# Patient Record
Sex: Male | Born: 1985 | Race: White | Hispanic: No | Marital: Single | Smoking: Current every day smoker
Health system: Southern US, Community
[De-identification: ages and names within clinical notes are randomized; demographics above are authoritative.]

## PROBLEM LIST (undated history)

## (undated) HISTORY — PX: KNEE SURGERY: SHX244

---

## 2019-11-16 ENCOUNTER — Other Ambulatory Visit: Payer: Self-pay

## 2019-11-16 ENCOUNTER — Encounter (HOSPITAL_BASED_OUTPATIENT_CLINIC_OR_DEPARTMENT_OTHER): Payer: Self-pay | Admitting: *Deleted

## 2019-11-16 DIAGNOSIS — M5137 Other intervertebral disc degeneration, lumbosacral region: Secondary | ICD-10-CM | POA: Insufficient documentation

## 2019-11-16 DIAGNOSIS — F17210 Nicotine dependence, cigarettes, uncomplicated: Secondary | ICD-10-CM | POA: Insufficient documentation

## 2019-11-16 LAB — URINALYSIS, ROUTINE W REFLEX MICROSCOPIC
Bilirubin Urine: NEGATIVE
Glucose, UA: NEGATIVE mg/dL
Hgb urine dipstick: NEGATIVE
Ketones, ur: NEGATIVE mg/dL
Leukocytes,Ua: NEGATIVE
Nitrite: NEGATIVE
Protein, ur: NEGATIVE mg/dL
Specific Gravity, Urine: >1.03 — ABNORMAL HIGH (ref 1.005–1.030)
pH: 5.5 (ref 5.0–8.0)

## 2019-11-16 NOTE — ED Triage Notes (Addendum)
Lower back pain. States he has had the pain while in prison for 6 months. He got out of prison 2 days ago and came  here for xrays.

## 2019-11-17 ENCOUNTER — Emergency Department (HOSPITAL_BASED_OUTPATIENT_CLINIC_OR_DEPARTMENT_OTHER): Payer: Self-pay

## 2019-11-17 ENCOUNTER — Emergency Department (HOSPITAL_BASED_OUTPATIENT_CLINIC_OR_DEPARTMENT_OTHER)
Admission: EM | Admit: 2019-11-17 | Discharge: 2019-11-17 | Disposition: A | Discharge status: Home / Self Care | Payer: Self-pay | Attending: Emergency Medicine | Admitting: Emergency Medicine

## 2019-11-17 DIAGNOSIS — M5137 Other intervertebral disc degeneration, lumbosacral region: Secondary | ICD-10-CM

## 2019-11-17 DIAGNOSIS — M545 Low back pain, unspecified: Secondary | ICD-10-CM

## 2019-11-17 MED ORDER — NAPROXEN 250 MG PO TABS
500.0000 mg | ORAL_TABLET | Freq: Once | ORAL | Status: AC
  Administered 2019-11-17: 500 mg via ORAL
  Filled 2019-11-17: qty 2

## 2019-11-17 MED ORDER — OXYCODONE-ACETAMINOPHEN 5-325 MG PO TABS: 1.0000 | ORAL_TABLET | Freq: Four times a day (QID) | ORAL | 0 refills | Status: AC | PRN

## 2019-11-17 MED ORDER — NAPROXEN 375 MG PO TABS: ORAL_TABLET | ORAL | 0 refills | Status: AC

## 2019-11-17 NOTE — ED Notes (Signed)
Pt states being on waiting list in jail for back xrays 'for months' was dc'd this AM & states [facility] directed him to ER to be evaluated. Pain across entire lower back and does not radiate.

## 2019-11-17 NOTE — ED Provider Notes (Signed)
[facility] Provider Note: Lowella Dell, MD, FACEP  CSN: [id number] MRN: [id number] ARRIVAL: 11/16/19 at 2138 ROOM: [facility]04/[facility]04   CHIEF COMPLAINT  Back Pain   HISTORY OF PRESENT ILLNESS  11/17/19 3:58 AM David Moran is a 34 y.o. male who has had low back pain for 6 months while incarcerated.  He got out of prison 2 days ago and is here requesting x-rays.  It is bilateral but worse on the left.  He rates his pain as a 7 out of 10, worse with palpation or movement of the left lower back.  He was previously on oxycodone for chronic knee pain but has not had any medications for the past 6 months due to incarceration.  He denies any radiation of the pain to his buttock or leg.  He is having no numbness or weakness with it.   History reviewed. No pertinent past medical history.  Past Surgical History:  Procedure Laterality Date  . KNEE SURGERY      No family history on file.  Social History   Tobacco Use  . Smoking status: Current Every Day Smoker  . Smokeless tobacco: Never Used  Substance Use Topics  . Alcohol use: Not Currently  . Drug use: Not Currently    Prior to Admission medications   Medication Sig Start Date End Date Taking? Authorizing Provider  naproxen (NAPROSYN) 375 MG tablet Take 1 tablet twice daily as needed for back pain. 11/17/19   Ronna Herskowitz, MD  oxyCODONE-acetaminophen (PERCOCET) 5-325 MG tablet Take 1 tablet by mouth every 6 (six) hours as needed for severe pain. 11/17/19   Lorea Kupfer, Jonny Ruiz, MD    Allergies Patient has no known allergies.   REVIEW OF SYSTEMS  Negative except as noted here or in the History of Present Illness.   PHYSICAL EXAMINATION  Initial Vital Signs Blood pressure 121/83, pulse 90, temperature 98.7 F (37.1 C), temperature source Oral, resp. rate 16, height 6' (1.829 m), weight 85.7 kg, SpO2 100 %.  Examination General: Well-developed, well-nourished male in no acute distress; appearance consistent with age of  record HENT: normocephalic; atraumatic Eyes: Normal appearance Neck: supple Heart: regular rate and rhythm Lungs: clear to auscultation bilaterally Abdomen: soft; nondistended; nontender; bowel sounds present Back: Left paralumbar tenderness; positive straight leg raise on the left at 45 degrees Extremities: No deformity; full range of motion; pulses normal Neurologic: Awake, alert and oriented; motor function intact in all extremities and symmetric; no facial droop Skin: Warm and dry Psychiatric: Normal mood and affect   RESULTS  Summary of this visit's results, reviewed and interpreted by myself:   EKG Interpretation  Date/Time:    Ventricular Rate:    PR Interval:    QRS Duration:   QT Interval:    QTC Calculation:   R Axis:     Text Interpretation:        Laboratory Studies: Results for orders placed or performed during the hospital encounter of 11/17/19 (from the past 24 hour(s))  Urinalysis, Routine w reflex microscopic     Status: Abnormal   Collection Time: 11/16/19 10:00 PM  Result Value Ref Range   Color, Urine YELLOW YELLOW   APPearance CLEAR CLEAR   Specific Gravity, Urine >1.030 (H) 1.005 - 1.030   pH 5.5 5.0 - 8.0   Glucose, UA NEGATIVE NEGATIVE mg/dL   Hgb urine dipstick NEGATIVE NEGATIVE   Bilirubin Urine NEGATIVE NEGATIVE   Ketones, ur NEGATIVE NEGATIVE mg/dL   Protein, ur NEGATIVE NEGATIVE mg/dL  Nitrite NEGATIVE NEGATIVE   Leukocytes,Ua NEGATIVE NEGATIVE   Imaging Studies: DG Lumbar Spine 2-3 Views  Result Date: 11/17/2019 CLINICAL DATA:  Low back pain for 6 months. EXAM: LUMBAR SPINE - 2-3 VIEW COMPARISON:  None. FINDINGS: There are 5 non rib-bearing lumbar type vertebrae. There is mild lumbar dextroscoliosis, and there is slight retrolisthesis of L5 on S1. No fracture is identified. Disc space narrowing is mild at L4-5 and moderate at L5-S1. IMPRESSION: Lumbar dextroscoliosis and lower lumbar disc degeneration, greatest at L5-S1. Electronically  Signed   By: Sebastian Ache M.D.   On: 11/17/2019 04:23    ED COURSE and MDM  Nursing notes, initial and subsequent vitals signs, including pulse oximetry, reviewed and interpreted by myself.  Vitals:   11/16/19 2154 11/16/19 2156 11/16/19 2157 11/17/19 0240  BP: 119/76   121/83  Pulse: 94   90  Resp: 19   16  Temp:   98.4 F (36.9 C) 98.7 F (37.1 C)  TempSrc:   Oral Oral  SpO2: 100%   100%  Weight:  85.7 kg    Height:  6' (1.829 m)     Medications  naproxen (NAPROSYN) tablet 500 mg (has no administration in time range)    We will refer patient to sports medicine for further evaluation.  PROCEDURES  Procedures   ED DIAGNOSES     ICD-10-CM   1. Degeneration of lumbar or lumbosacral intervertebral disc  M51.37   2. Acute left-sided low back pain without sciatica  M54.5        Keianna Signer, Jonny Ruiz, MD 11/17/19 [phone]

## 2019-11-29 ENCOUNTER — Telehealth: Payer: Self-pay | Admitting: Family Medicine

## 2019-11-29 NOTE — Telephone Encounter (Signed)
Dr. Read Drivers referred pt to Dr. Jordan Likes for trmt --Unsuccessful attempt to contact pt (llist ph# to a [facility] agency no answer nor ability to lv msg)  --glh

## 2021-08-04 IMAGING — DX DG LUMBAR SPINE 2-3V
3 series · 3 of 3 positions shown · non-contrast
Comparison: None.

CLINICAL DATA: Low back pain for 6 months.

EXAM:
LUMBAR SPINE - 2-3 VIEW

[l-spine ap (1 of 2)]
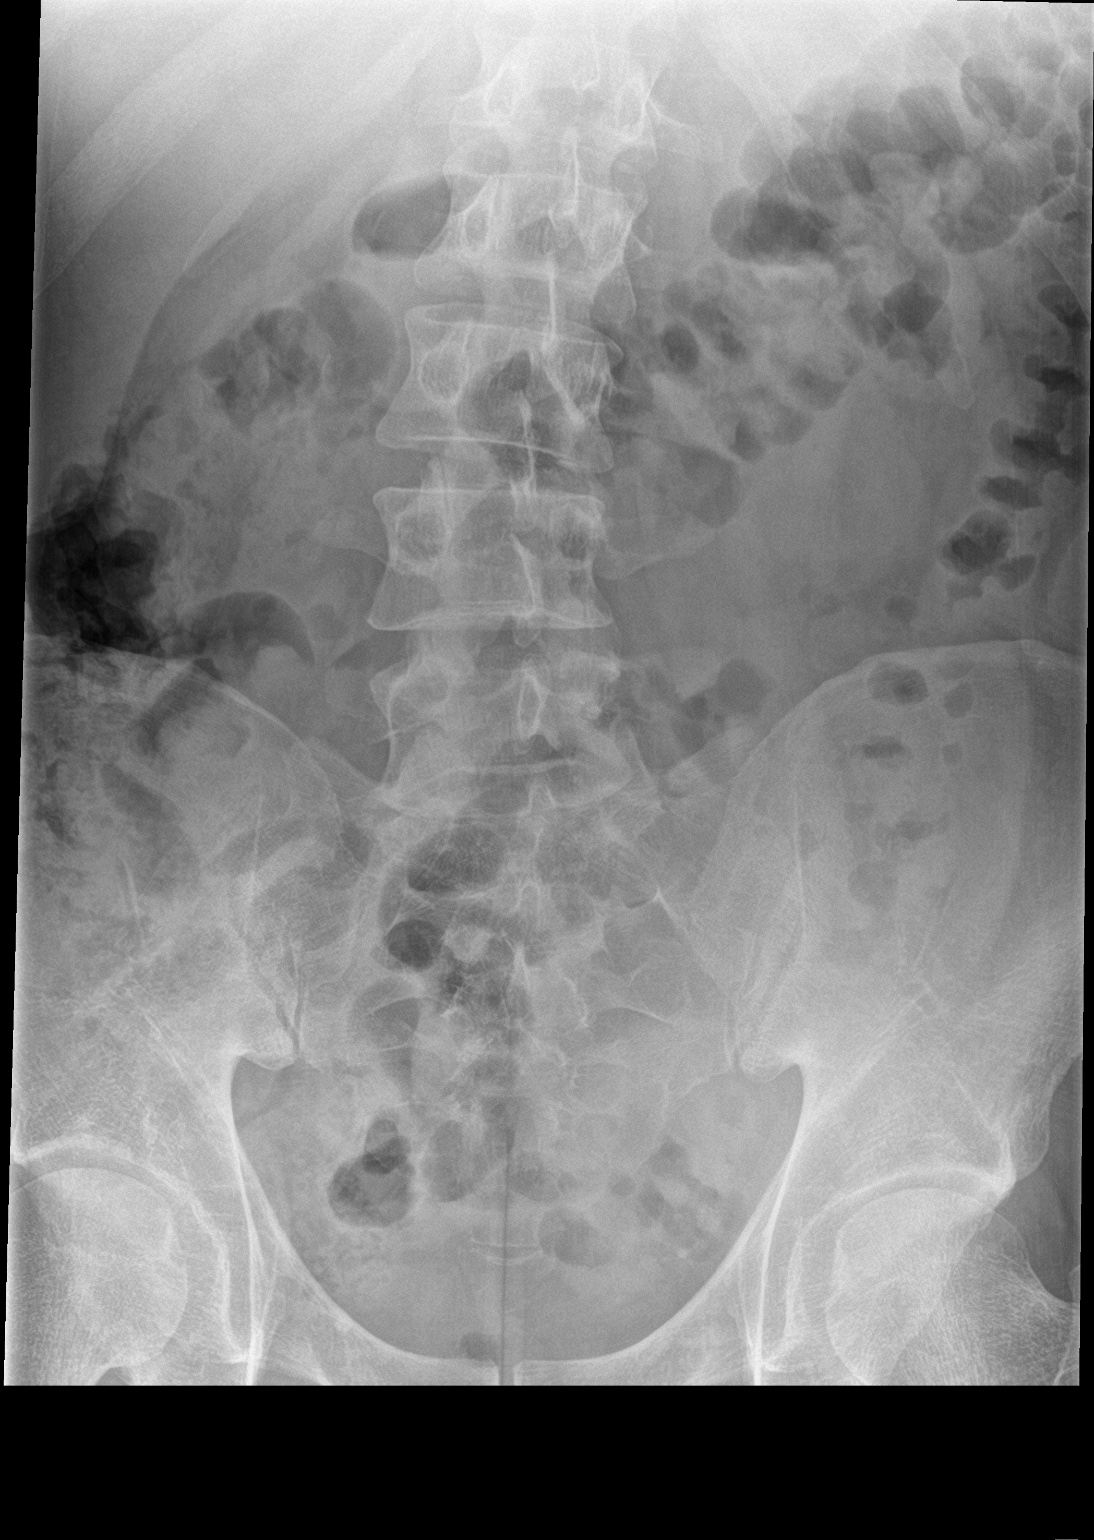

[l-spine lat]
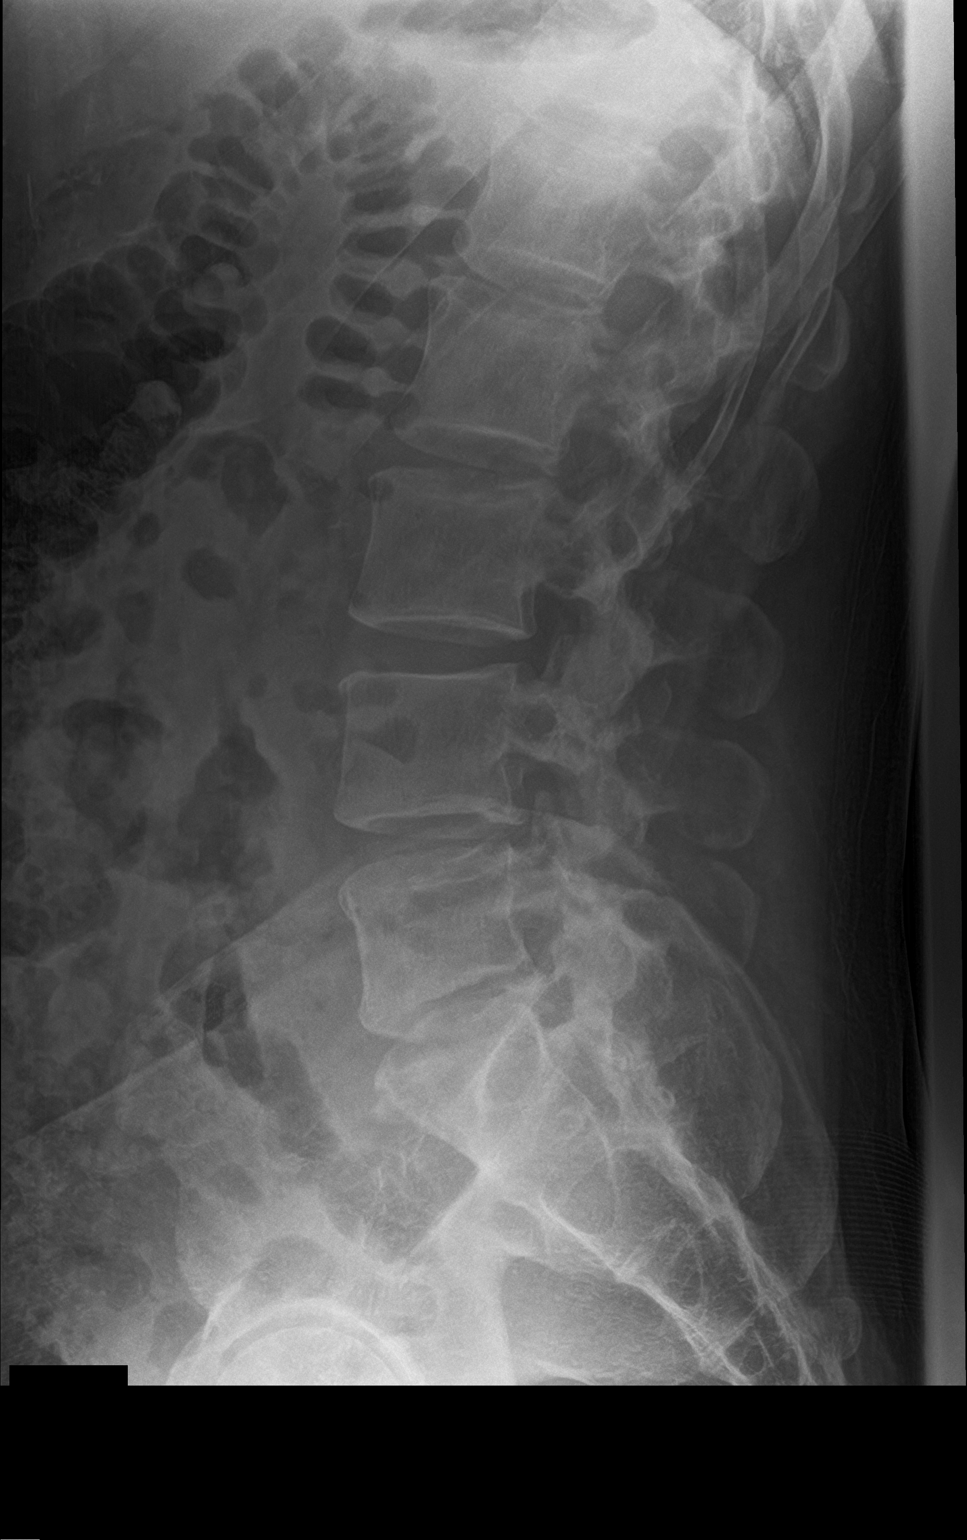

[l-spine ap (2 of 2)]
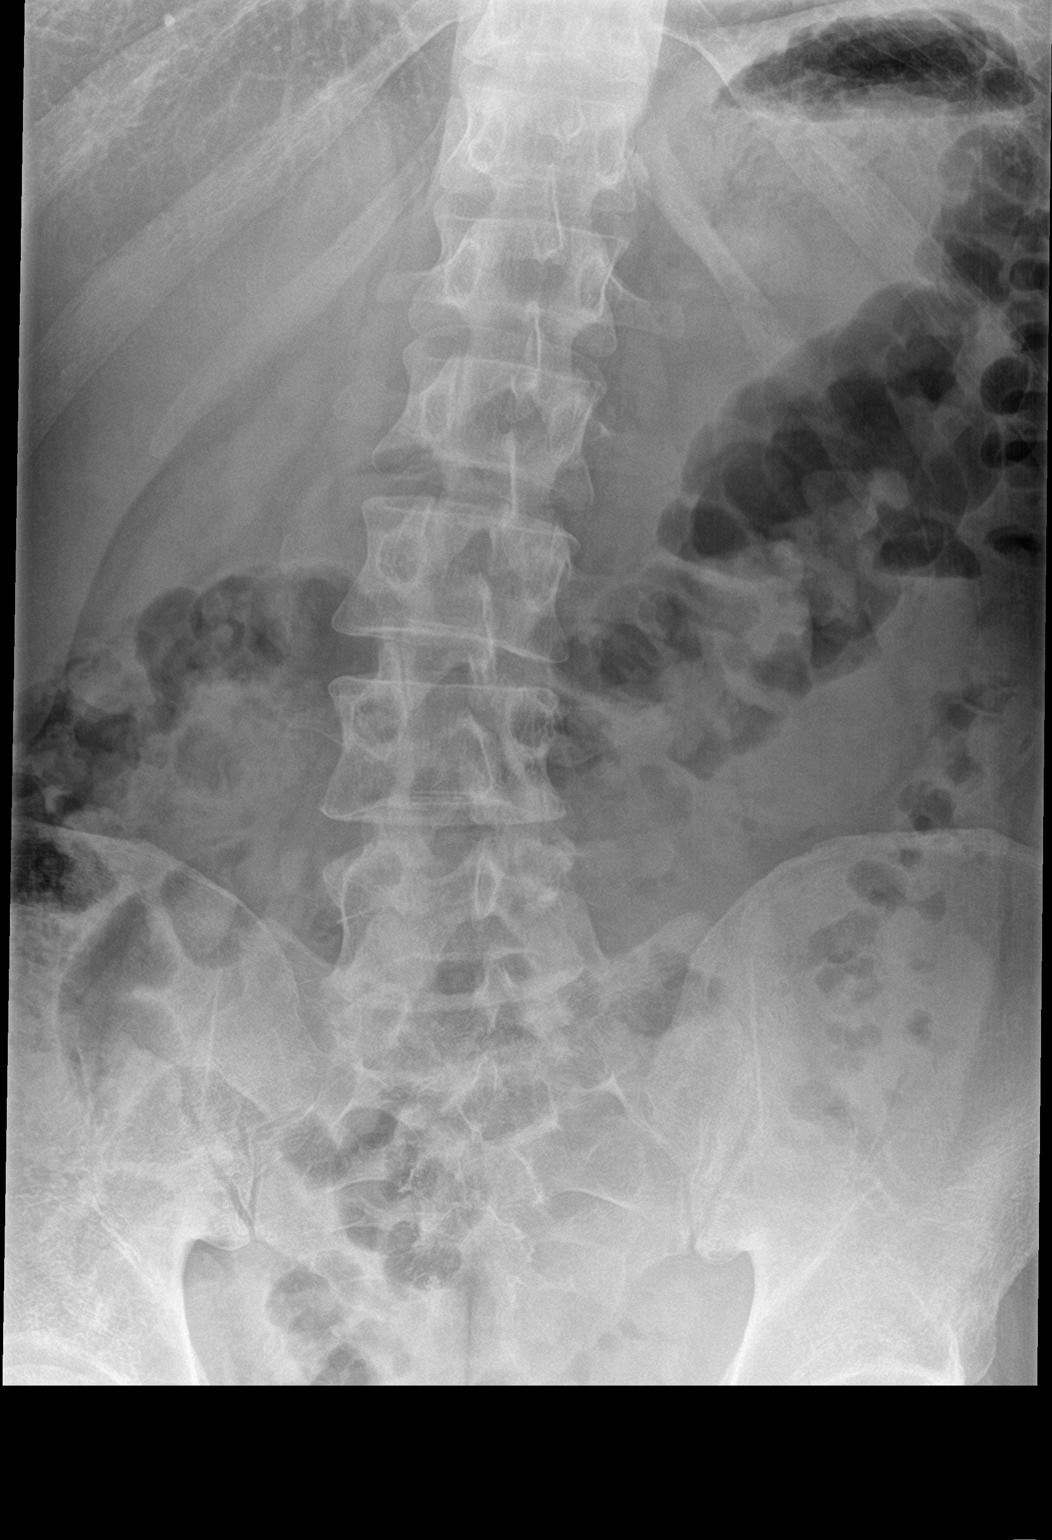

[3 of 3 positions shown; findings below may reference images not displayed]

FINDINGS: There are 5 non rib-bearing lumbar type vertebrae. There is mild
lumbar dextroscoliosis, and there is slight retrolisthesis of L5 on
S1. No fracture is identified. Disc space narrowing is mild at L4-5
and moderate at L5-S1.
IMPRESSION: Lumbar dextroscoliosis and lower lumbar disc degeneration, greatest
at L5-S1.

## 2022-08-12 ENCOUNTER — Encounter: Payer: Self-pay | Admitting: *Deleted
